# Patient Record
Sex: Male | Born: 1990 | Race: White | Hispanic: No | Marital: Married | Smoking: Never smoker
Health system: Southern US, Community
[De-identification: ages and names within clinical notes are randomized; demographics above are authoritative.]

## PROBLEM LIST (undated history)

## (undated) DIAGNOSIS — K589 Irritable bowel syndrome without diarrhea: Secondary | ICD-10-CM

## (undated) DIAGNOSIS — K219 Gastro-esophageal reflux disease without esophagitis: Secondary | ICD-10-CM

## (undated) DIAGNOSIS — J45909 Unspecified asthma, uncomplicated: Secondary | ICD-10-CM

## (undated) HISTORY — DX: Irritable bowel syndrome, unspecified: K58.9

## (undated) HISTORY — DX: Unspecified asthma, uncomplicated: J45.909

---
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
## 2010-07-20 ENCOUNTER — Emergency Department (HOSPITAL_COMMUNITY): Admission: EM | Admit: 2010-07-20 | Discharge: 2010-07-21 | Payer: Self-pay | Admitting: Emergency Medicine

## 2015-09-23 ENCOUNTER — Other Ambulatory Visit: Payer: Self-pay | Admitting: Student

## 2015-09-23 DIAGNOSIS — R1084 Generalized abdominal pain: Secondary | ICD-10-CM

## 2015-09-25 ENCOUNTER — Ambulatory Visit
Admission: RE | Admit: 2015-09-25 | Discharge: 2015-09-25 | Disposition: A | Discharge status: Home / Self Care | Payer: 59 | Source: Ambulatory Visit | Attending: Student | Admitting: Student

## 2015-09-25 DIAGNOSIS — R1084 Generalized abdominal pain: Secondary | ICD-10-CM | POA: Diagnosis present

## 2017-07-17 ENCOUNTER — Emergency Department: Payer: 59

## 2017-07-17 ENCOUNTER — Emergency Department
Admission: EM | Admit: 2017-07-17 | Discharge: 2017-07-17 | Disposition: A | Discharge status: Home / Self Care | Payer: 59 | Attending: Student in an Organized Health Care Education/Training Program | Admitting: Student in an Organized Health Care Education/Training Program

## 2017-07-17 ENCOUNTER — Encounter: Payer: Self-pay | Admitting: Emergency Medicine

## 2017-07-17 DIAGNOSIS — R109 Unspecified abdominal pain: Secondary | ICD-10-CM | POA: Diagnosis not present

## 2017-07-17 DIAGNOSIS — R300 Dysuria: Secondary | ICD-10-CM | POA: Diagnosis present

## 2017-07-17 HISTORY — DX: Gastro-esophageal reflux disease without esophagitis: K21.9

## 2017-07-17 LAB — URINALYSIS, COMPLETE (UACMP) WITH MICROSCOPIC
BILIRUBIN URINE: NEGATIVE
Bacteria, UA: NONE SEEN
GLUCOSE, UA: NEGATIVE mg/dL
HGB URINE DIPSTICK: NEGATIVE
Ketones, ur: 20 mg/dL — AB
Leukocytes, UA: NEGATIVE
NITRITE: NEGATIVE
PH: 6 (ref 5.0–8.0)
Protein, ur: NEGATIVE mg/dL
RBC / HPF: NONE SEEN RBC/hpf (ref 0–5)
SPECIFIC GRAVITY, URINE: 1.002 — AB (ref 1.005–1.030)
Squamous Epithelial / LPF: NONE SEEN

## 2017-07-17 LAB — CBC WITH DIFFERENTIAL/PLATELET
Basophils Absolute: 0.1 10*3/uL (ref 0–0.1)
Basophils Relative: 1 %
EOS ABS: 0 10*3/uL (ref 0–0.7)
EOS PCT: 0 %
HCT: 48.5 % (ref 40.0–52.0)
HEMOGLOBIN: 16.7 g/dL (ref 13.0–18.0)
LYMPHS ABS: 2.6 10*3/uL (ref 1.0–3.6)
Lymphocytes Relative: 25 %
MCH: 30.7 pg (ref 26.0–34.0)
MCHC: 34.5 g/dL (ref 32.0–36.0)
MCV: 88.9 fL (ref 80.0–100.0)
MONOS PCT: 7 %
Monocytes Absolute: 0.8 10*3/uL (ref 0.2–1.0)
NEUTROS PCT: 67 %
Neutro Abs: 7.2 10*3/uL — ABNORMAL HIGH (ref 1.4–6.5)
Platelets: 350 10*3/uL (ref 150–440)
RBC: 5.45 MIL/uL (ref 4.40–5.90)
RDW: 13.3 % (ref 11.5–14.5)
WBC: 10.7 10*3/uL — ABNORMAL HIGH (ref 3.8–10.6)

## 2017-07-17 LAB — COMPREHENSIVE METABOLIC PANEL
ALT: 23 U/L (ref 17–63)
ANION GAP: 13 (ref 5–15)
AST: 27 U/L (ref 15–41)
Albumin: 5.2 g/dL — ABNORMAL HIGH (ref 3.5–5.0)
Alkaline Phosphatase: 46 U/L (ref 38–126)
BILIRUBIN TOTAL: 1.6 mg/dL — AB (ref 0.3–1.2)
BUN: 12 mg/dL (ref 6–20)
CALCIUM: 9.6 mg/dL (ref 8.9–10.3)
CO2: 24 mmol/L (ref 22–32)
Chloride: 103 mmol/L (ref 101–111)
Creatinine, Ser: 1.07 mg/dL (ref 0.61–1.24)
Glucose, Bld: 73 mg/dL (ref 65–99)
POTASSIUM: 3.7 mmol/L (ref 3.5–5.1)
SODIUM: 140 mmol/L (ref 135–145)
TOTAL PROTEIN: 8.4 g/dL — AB (ref 6.5–8.1)

## 2017-07-17 LAB — LIPASE, BLOOD: LIPASE: 35 U/L (ref 11–51)

## 2017-07-17 MED ORDER — TRAMADOL HCL 50 MG PO TABS: 50.0000 mg | ORAL_TABLET | Freq: Four times a day (QID) | ORAL | 0 refills | Status: AC | PRN

## 2017-07-17 MED ORDER — AZITHROMYCIN 500 MG PO TABS
1000.0000 mg | ORAL_TABLET | Freq: Once | ORAL | Status: AC
  Administered 2017-07-17: 1000 mg via ORAL
  Filled 2017-07-17: qty 2

## 2017-07-17 MED ORDER — KETOROLAC TROMETHAMINE 30 MG/ML IJ SOLN
15.0000 mg | Freq: Once | INTRAMUSCULAR | Status: AC
  Administered 2017-07-17: 15 mg via INTRAVENOUS
  Filled 2017-07-17: qty 1

## 2017-07-17 MED ORDER — PROMETHAZINE HCL 12.5 MG PO TABS: 12.5000 mg | ORAL_TABLET | Freq: Four times a day (QID) | ORAL | 0 refills | Status: DC | PRN

## 2017-07-17 MED ORDER — SODIUM CHLORIDE 0.9 % IV BOLUS (SEPSIS)
1000.0000 mL | Freq: Once | INTRAVENOUS | Status: AC
  Administered 2017-07-17: 1000 mL via INTRAVENOUS

## 2017-07-17 MED ORDER — SODIUM CHLORIDE 0.9 % IV BOLUS (SEPSIS): 1000.0000 mL | Freq: Once | INTRAVENOUS | Status: DC

## 2017-07-17 MED ORDER — CEFTRIAXONE SODIUM 250 MG IJ SOLR
250.0000 mg | Freq: Once | INTRAMUSCULAR | Status: AC
  Administered 2017-07-17: 250 mg via INTRAMUSCULAR
  Filled 2017-07-17: qty 250

## 2017-07-17 MED ORDER — IOPAMIDOL (ISOVUE-300) INJECTION 61%
100.0000 mL | Freq: Once | INTRAVENOUS | Status: AC | PRN
  Administered 2017-07-17: 100 mL via INTRAVENOUS

## 2017-07-17 MED ORDER — DOXYCYCLINE HYCLATE 50 MG PO CAPS: 100.0000 mg | ORAL_CAPSULE | Freq: Two times a day (BID) | ORAL | 0 refills | Status: AC

## 2017-07-17 MED ORDER — MORPHINE SULFATE (PF) 4 MG/ML IV SOLN
4.0000 mg | INTRAVENOUS | Status: DC | PRN
  Administered 2017-07-17: 4 mg via INTRAVENOUS
  Filled 2017-07-17: qty 1

## 2017-07-17 MED ORDER — PROMETHAZINE HCL 25 MG/ML IJ SOLN
12.5000 mg | Freq: Four times a day (QID) | INTRAMUSCULAR | Status: DC | PRN
  Administered 2017-07-17: 12.5 mg via INTRAVENOUS
  Filled 2017-07-17: qty 1

## 2017-07-17 NOTE — ED Triage Notes (Signed)
Back/flank pain x 2 weeks. Has been seen at [facility] 2x with ketones in urine. Weight loss of approx 8 lbs in that time stating unable to eat. No appetite and abd pain.

## 2017-07-17 NOTE — ED Notes (Signed)
ED Provider at bedside. 

## 2017-07-17 NOTE — ED Provider Notes (Signed)
[facility] Emergency Department Provider Note    First MD Initiated Contact with Patient 07/17/17 1514     (approximate)  I have reviewed the triage vital signs and the nursing notes.   HISTORY  Chief Complaint Dysuria    HPI Johnathan Caldwell is a 26 y.o. male presents with chief complaint of bilateral flank pain and dark and foul-smelling urine. Patient states it feels like kidney infections which she has had in the past. States he does have some burning when he is. Denies any nausea vomiting or diarrhea. Does have issues with acid reflux and has been on Nexium. States he's had decreased appetite over the past 2 weeks and has had several pound weight loss. No fevers at home. No shortness of breath or chest pain. No family history of inflammatory bowel disease. No personal history of diabetes or kidney stones.   Past Medical History:  Diagnosis Date  . Acid reflux    History reviewed. No pertinent family history. History reviewed. No pertinent surgical history. There are no active problems to display for this patient.     Prior to Admission medications   Medication Sig Start Date End Date Taking? Authorizing Provider  doxycycline (VIBRAMYCIN) 50 MG capsule Take 2 capsules (100 mg total) by mouth 2 (two) times daily. 07/17/17 07/27/17  Willy Eddy, MD  promethazine (PHENERGAN) 12.5 MG tablet Take 1 tablet (12.5 mg total) by mouth every 6 (six) hours as needed for nausea or vomiting. 07/17/17   Willy Eddy, MD  traMADol (ULTRAM) 50 MG tablet Take 1 tablet (50 mg total) by mouth every 6 (six) hours as needed. 07/17/17 07/17/18  Willy Eddy, MD    Allergies Patient has no known allergies.    Social History Social History  Substance Use Topics  . Smoking status: Never Smoker  . Smokeless tobacco: Never Used  . Alcohol use No    Review of Systems Patient denies headaches, rhinorrhea, blurry vision, numbness, shortness of breath,  chest pain, edema, cough, abdominal pain, nausea, vomiting, diarrhea, dysuria, fevers, rashes or hallucinations unless otherwise stated above in HPI. ____________________________________________   PHYSICAL EXAM:  VITAL SIGNS: Vitals:   07/17/17 1502  BP: 125/87  Pulse: (!) 103  Resp: 16  Temp: 98.1 F (36.7 C)  SpO2: 99%    Constitutional: Alert and oriented. Well appearing and in no acute distress. Eyes: Conjunctivae are normal.  Head: Atraumatic. Nose: No congestion/rhinnorhea. Mouth/Throat: Mucous membranes are moist.   Neck: No stridor. Painless ROM.  Cardiovascular: Normal rate, regular rhythm. Grossly normal heart sounds.  Good peripheral circulation. Respiratory: Normal respiratory effort.  No retractions. Lungs CTAB. Gastrointestinal: Soft and nontender. No distention. No abdominal bruits. No CVA tenderness. Genitourinary:  Musculoskeletal: No lower extremity tenderness nor edema.  No joint effusions. Neurologic:  Normal speech and language. No gross focal neurologic deficits are appreciated. No facial droop Skin:  Skin is warm, dry and intact. No rash noted. Psychiatric: Mood and affect are normal. Speech and behavior are normal.  ____________________________________________   LABS (all labs ordered are listed, but only abnormal results are displayed)  Results for orders placed or performed during the hospital encounter of 07/17/17 (from the past 24 hour(s))  Urinalysis, Complete w Microscopic     Status: Abnormal   Collection Time: 07/17/17  3:16 PM  Result Value Ref Range   Color, Urine STRAW (A) YELLOW   APPearance CLEAR (A) CLEAR   Specific Gravity, Urine 1.002 (L) 1.005 - 1.030   pH  6.0 5.0 - 8.0   Glucose, UA NEGATIVE NEGATIVE mg/dL   Hgb urine dipstick NEGATIVE NEGATIVE   Bilirubin Urine NEGATIVE NEGATIVE   Ketones, ur 20 (A) NEGATIVE mg/dL   Protein, ur NEGATIVE NEGATIVE mg/dL   Nitrite NEGATIVE NEGATIVE   Leukocytes, UA NEGATIVE NEGATIVE   RBC /  HPF NONE SEEN 0 - 5 RBC/hpf   WBC, UA 0-5 0 - 5 WBC/hpf   Bacteria, UA NONE SEEN NONE SEEN   Squamous Epithelial / LPF NONE SEEN NONE SEEN  CBC with Differential/Platelet     Status: Abnormal   Collection Time: 07/17/17  3:16 PM  Result Value Ref Range   WBC 10.7 (H) 3.8 - 10.6 K/uL   RBC 5.45 4.40 - 5.90 MIL/uL   Hemoglobin 16.7 13.0 - 18.0 g/dL   HCT 45.448.5 09.840.0 - 11.952.0 %   MCV 88.9 80.0 - 100.0 fL   MCH 30.7 26.0 - 34.0 pg   MCHC 34.5 32.0 - 36.0 g/dL   RDW 14.713.3 82.911.5 - 56.214.5 %   Platelets 350 150 - 440 K/uL   Neutrophils Relative % 67 %   Neutro Abs 7.2 (H) 1.4 - 6.5 K/uL   Lymphocytes Relative 25 %   Lymphs Abs 2.6 1.0 - 3.6 K/uL   Monocytes Relative 7 %   Monocytes Absolute 0.8 0.2 - 1.0 K/uL   Eosinophils Relative 0 %   Eosinophils Absolute 0.0 0 - 0.7 K/uL   Basophils Relative 1 %   Basophils Absolute 0.1 0 - 0.1 K/uL  Comprehensive metabolic panel     Status: Abnormal   Collection Time: 07/17/17  3:16 PM  Result Value Ref Range   Sodium 140 135 - 145 mmol/L   Potassium 3.7 3.5 - 5.1 mmol/L   Chloride 103 101 - 111 mmol/L   CO2 24 22 - 32 mmol/L   Glucose, Bld 73 65 - 99 mg/dL   BUN 12 6 - 20 mg/dL   Creatinine, Ser 1.301.07 0.61 - 1.24 mg/dL   Calcium 9.6 8.9 - 86.510.3 mg/dL   Total Protein 8.4 (H) 6.5 - 8.1 g/dL   Albumin 5.2 (H) 3.5 - 5.0 g/dL   AST 27 15 - 41 U/L   ALT 23 17 - 63 U/L   Alkaline Phosphatase 46 38 - 126 U/L   Total Bilirubin 1.6 (H) 0.3 - 1.2 mg/dL   GFR calc non Af Amer >60 >60 mL/min   GFR calc Af Amer >60 >60 mL/min   Anion gap 13 5 - 15  Lipase, blood     Status: None   Collection Time: 07/17/17  3:16 PM  Result Value Ref Range   Lipase 35 11 - 51 U/L   ____________________________________________ ____________________________________________  RADIOLOGY  I personally reviewed all radiographic images ordered to evaluate for the above acute complaints and reviewed radiology reports and findings.  These findings were personally discussed with the  patient.  Please see medical record for radiology report.  ____________________________________________   PROCEDURES  Procedure(s) performed:  Procedures    Critical Care performed: no ____________________________________________   INITIAL IMPRESSION / ASSESSMENT AND PLAN / ED COURSE  Pertinent labs & imaging results that were available during my care of the patient were reviewed by me and considered in my medical decision making (see chart for details).  DDX: stone, pyelo, dehydration, dka, dm, enteritis, uti  Johnathan Caldwell is a 26 y.o. who presents to the ED with Back pain as described above. Patient afebrile and in no acute  distress. Blood work sent for the above differential shows mild leukocytosis but patient without any other signs or symptoms of acute infectious process. CT imaging ordered to evaluate for the above differential shows no acute intra-abdominal abnormality. Specifically there is no evidence of kidney stone or perinephric stranding to suggest pyelonephritis. There is no evidence of appendicitis. He does not have evidence of DKA or diabetes. Etiology of patient's pain most likely musculoskeletal in nature. Patient denies any h/o transmitted infections but based on his symptoms I have discussed with recommendation to empirically treat for STI and the patient agrees to this.   Patient stable for follow-up as an outpatient.  Have discussed with the patient and available family all diagnostics and treatments performed thus far and all questions were answered to the best of my ability. The patient demonstrates understanding and agreement with plan.       ____________________________________________   FINAL CLINICAL IMPRESSION(S) / ED DIAGNOSES  Final diagnoses:  Bilateral flank pain  Dysuria      NEW MEDICATIONS STARTED DURING THIS VISIT:  New Prescriptions   DOXYCYCLINE (VIBRAMYCIN) 50 MG CAPSULE    Take 2 capsules (100 mg total) by mouth 2 (two) times  daily.   PROMETHAZINE (PHENERGAN) 12.5 MG TABLET    Take 1 tablet (12.5 mg total) by mouth every 6 (six) hours as needed for nausea or vomiting.   TRAMADOL (ULTRAM) 50 MG TABLET    Take 1 tablet (50 mg total) by mouth every 6 (six) hours as needed.     Note:  This document was prepared using Dragon voice recognition software and may include unintentional dictation errors.    Willy Eddy, MD 07/17/17 [phone]

## 2017-07-17 NOTE — ED Notes (Signed)
FIRST NURSE NOTE: [facility], was found to have ketones in urinalysis.

## 2017-07-17 NOTE — Discharge Instructions (Signed)

## 2017-07-19 LAB — URINE CULTURE: Culture: NO GROWTH

## 2017-07-22 ENCOUNTER — Telehealth: Payer: Self-pay | Admitting: Emergency Medicine

## 2017-07-22 NOTE — Telephone Encounter (Signed)
Patient called to say he is having some diarrhea with taking the doxycycline.  He has been eating yogurt.  I advised him to follow up with pcp, but that he could always return here if needed.

## 2019-01-24 IMAGING — CT CT ABD-PELV W/ CM
2 of 4 series · 16 of 46 positions shown, 18 images · IV contrast (iopamidol)
Comparison: No priors.

CLINICAL DATA: 25-year-old male with history of back and flank pain
for the past 2 weeks. Weight loss of 8 pounds over the past 2 weeks.
Unable to eat.

EXAM:
CT ABDOMEN AND PELVIS WITH CONTRAST
TECHNIQUE: Multidetector CT imaging of the abdomen and pelvis was performed
using the standard protocol following bolus administration of
intravenous contrast.
CONTRAST:  100mL ZAUVGN-J33 IOPAMIDOL (ZAUVGN-J33) INJECTION 61%

[Series 2: routine abd/pel with · axial · 0.65mm/px · z∈[-892,-522]mm · 13 of 82 slices shown, 15 images]
[im 4/82  soft-tissue]
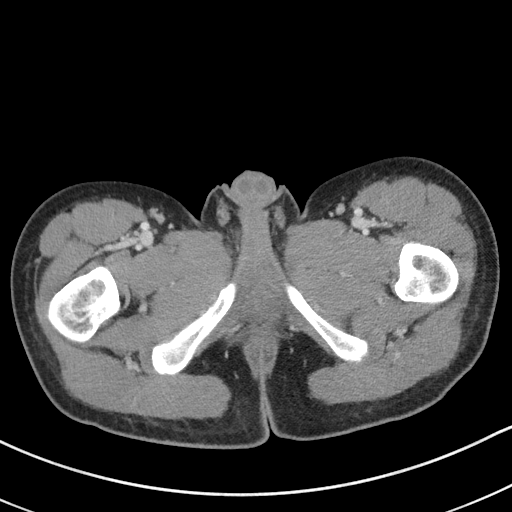
[im 4/82  bone]
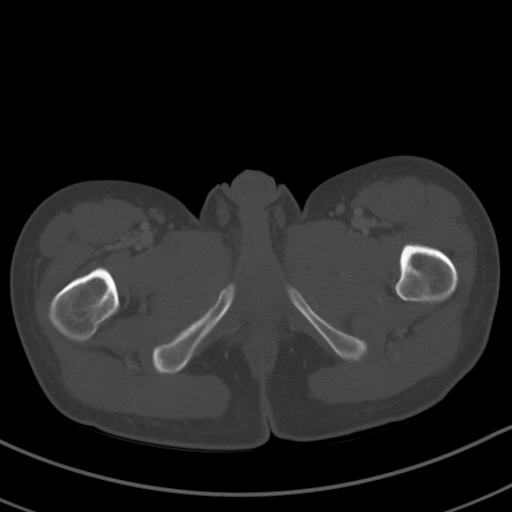
[im 10/82  soft-tissue]
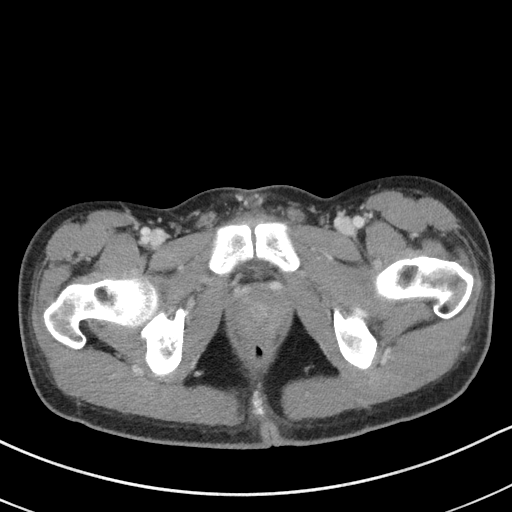
[im 17/82  soft-tissue]
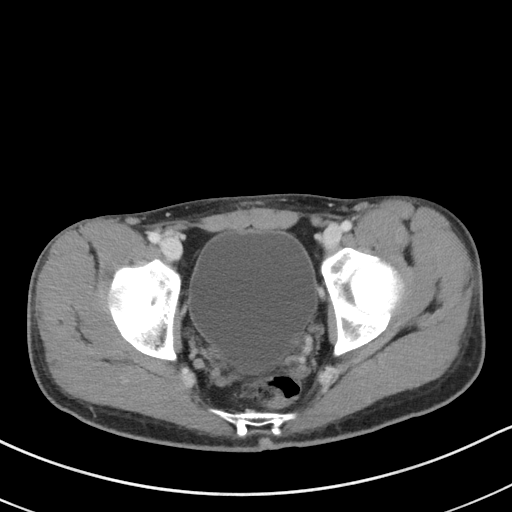
[im 23/82  soft-tissue]
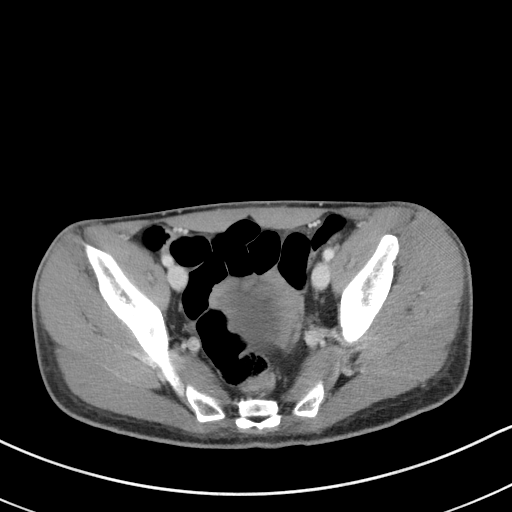
[im 30/82  soft-tissue]
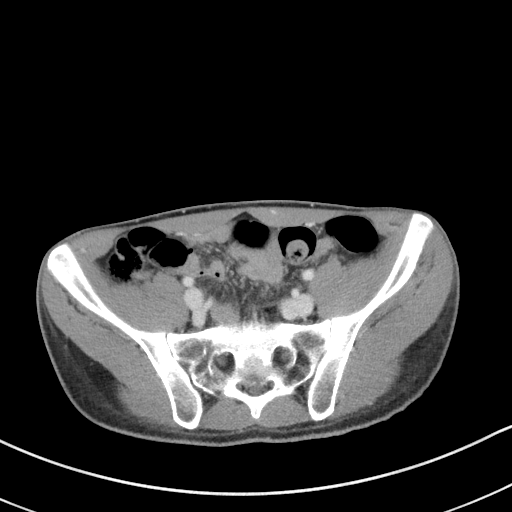
[im 36/82  soft-tissue]
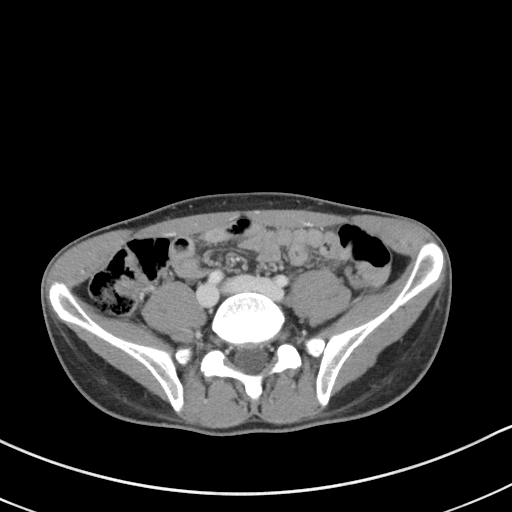
[im 43/82  soft-tissue]
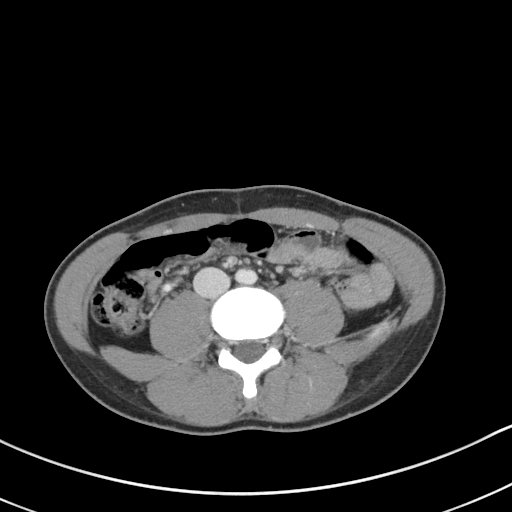
[im 46/82  soft-tissue]
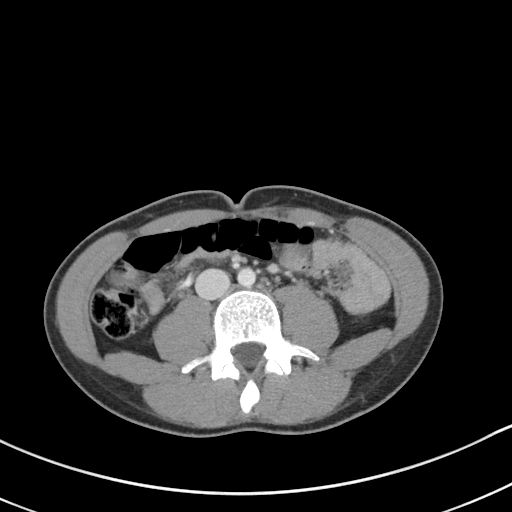
[im 52/82  soft-tissue]
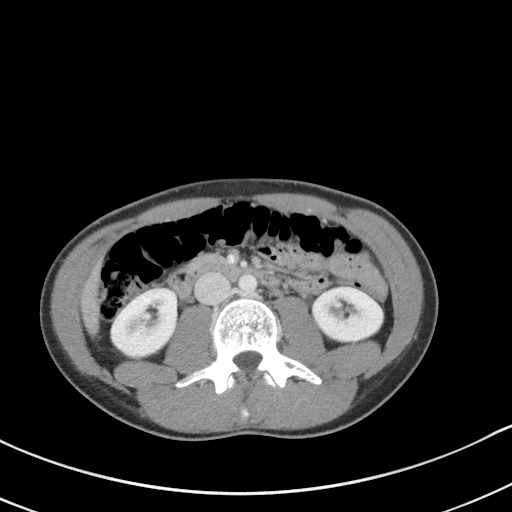
[im 52/82  bone]
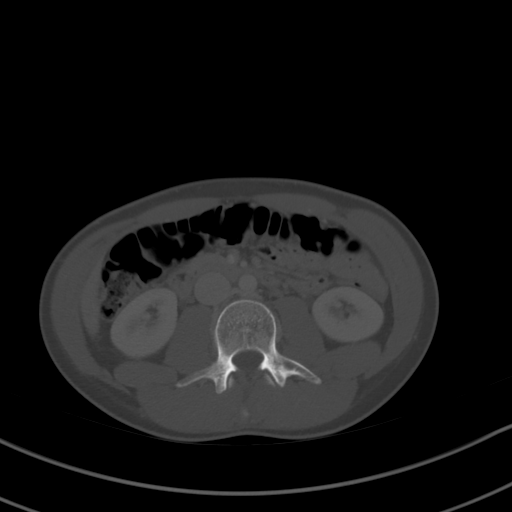
[im 59/82  soft-tissue]
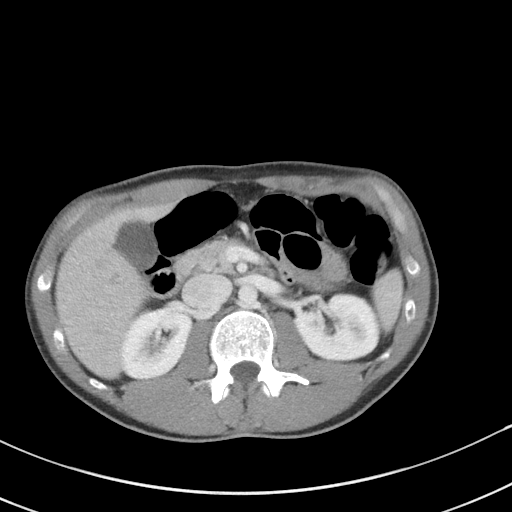
[im 65/82  soft-tissue]
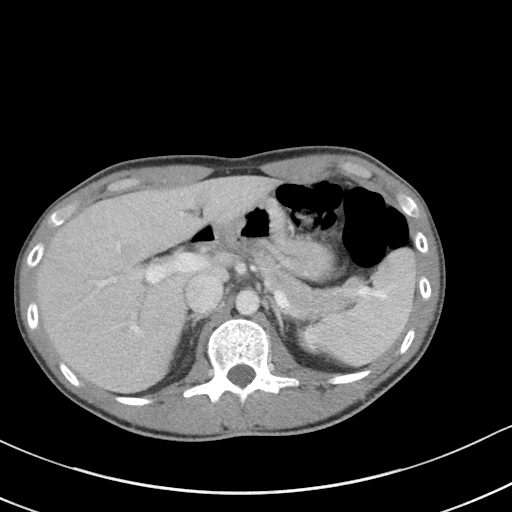
[im 72/82  soft-tissue]
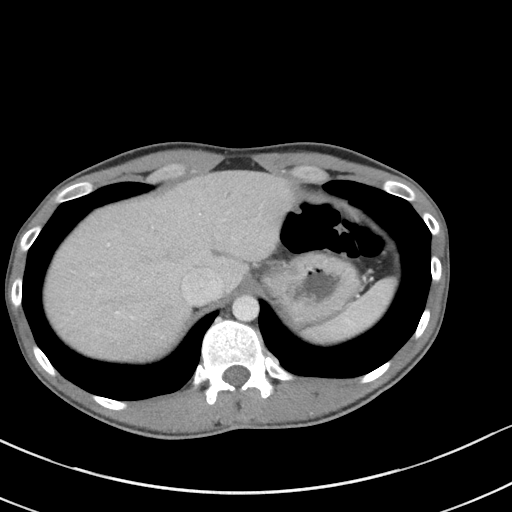
[im 78/82  soft-tissue]
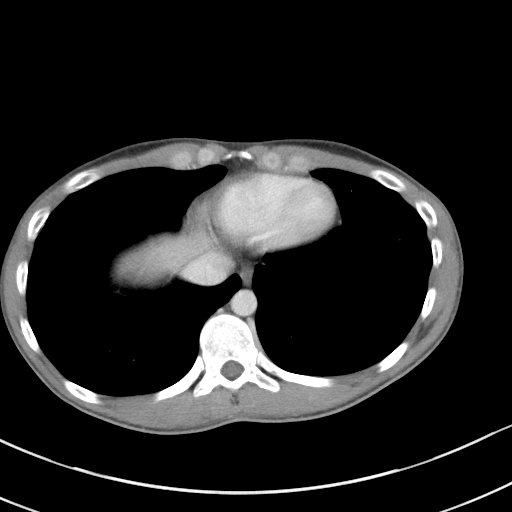

[Series 5: coronal st · coronal · 0.68mm/px · 3 of 67 slices shown]
[im 23/67  soft-tissue]
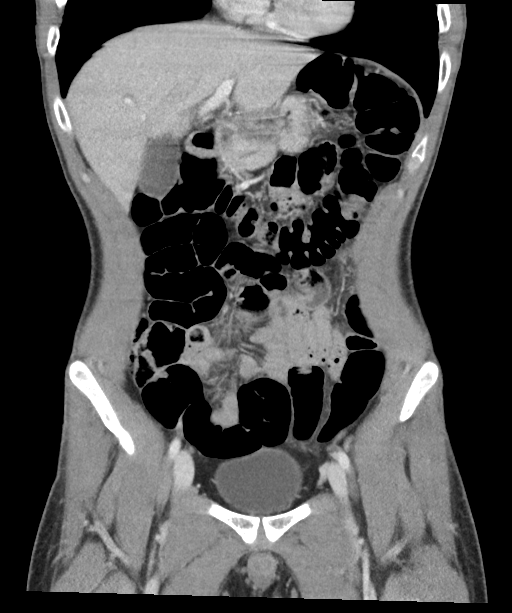
[im 30/67  soft-tissue]
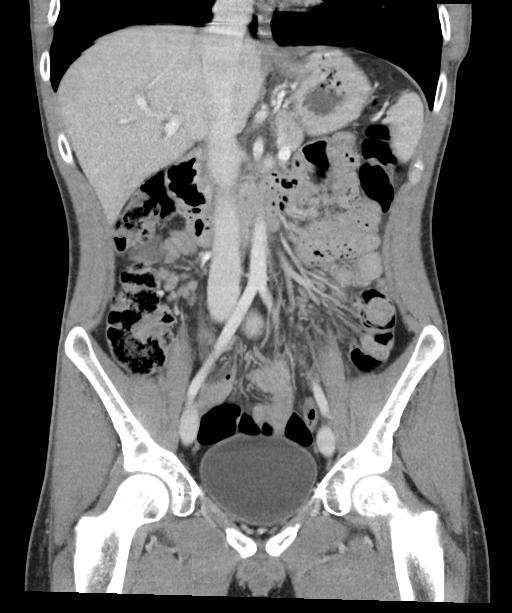
[im 37/67  soft-tissue]
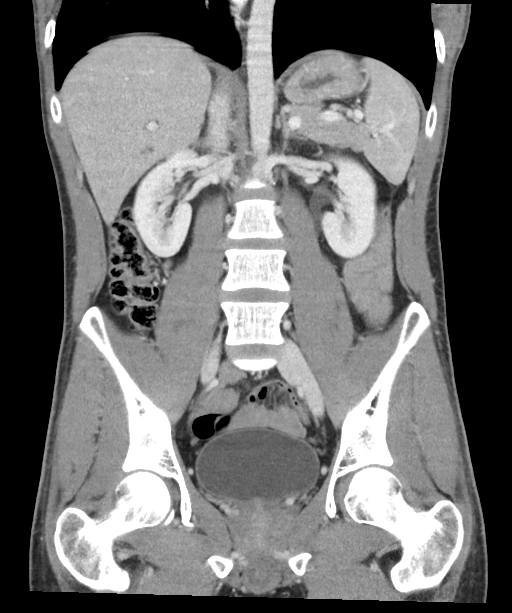

[16 of 46 positions shown; findings below may reference images not displayed]

FINDINGS: Lower chest: Unremarkable.

Hepatobiliary: 8 mm low-attenuation lesion in segment 6 of the liver
is too small to characterize, but is statistically likely a tiny
cyst. No other suspicious hepatic lesions are noted. No intra or
extrahepatic biliary ductal dilatation. There appears to be mild
periportal edema. Gallbladder is normal in appearance.

Pancreas: No pancreatic mass. No pancreatic ductal dilatation. No
pancreatic or peripancreatic fluid or inflammatory changes.

Spleen: Unremarkable.

Adrenals/Urinary Tract: Bilateral kidneys and bilateral adrenal
glands are normal in appearance. No hydroureteronephrosis. Urinary
bladder is normal in appearance.

Stomach/Bowel: Normal appearance of the stomach. No pathologic
dilatation of small bowel or colon. The appendix is not confidently
identified and may be surgically absent. Regardless, there are no
inflammatory changes noted adjacent to the cecum to suggest the
presence of an acute appendicitis at this time.

Vascular/Lymphatic: No significant atherosclerotic disease, aneurysm
or dissection noted in the abdominal or pelvic vasculature.
Circumaortic left renal vein (normal anatomical variant)
incidentally noted. No lymphadenopathy identified in the abdomen or
pelvis.

Reproductive: Prostate gland and seminal vesicles are unremarkable
in appearance.

Other: No significant volume of ascites.  No pneumoperitoneum.

Musculoskeletal: There are no aggressive appearing lytic or blastic
lesions noted in the visualized portions of the skeleton.
IMPRESSION: 1. No acute findings are noted in the abdomen or pelvis to account
for the patient's symptoms.
2. Incidental findings, as above.

## 2020-03-14 ENCOUNTER — Ambulatory Visit: Payer: 59 | Attending: Internal Medicine

## 2020-03-14 DIAGNOSIS — Z23 Encounter for immunization: Secondary | ICD-10-CM

## 2020-03-14 NOTE — Progress Notes (Signed)
   Covid-19 Vaccination Clinic  Name:  Johnathan Caldwell    MRN: [id number] DOB: [birth year]  03/14/2020  [name] was observed post Covid-19 immunization for 15 minutes without incident. He was provided with Vaccine Information Sheet and instruction to access the V-Safe system.   [name] was instructed to call 911 with any severe reactions post vaccine: Marland Kitchen Difficulty breathing  . Swelling of face and throat  . A fast heartbeat  . A bad rash all over body  . Dizziness and weakness   Immunizations Administered    Name Date Dose VIS Date Route   Pfizer COVID-19 Vaccine 03/14/2020 11:13 AM 0.3 mL 11/16/2019 Intramuscular   Manufacturer: [employer], [employer]   Lot: [id number]   NDC: [address]-3

## 2020-04-08 ENCOUNTER — Ambulatory Visit: Payer: 59 | Attending: Internal Medicine

## 2020-04-08 DIAGNOSIS — Z23 Encounter for immunization: Secondary | ICD-10-CM

## 2020-04-08 NOTE — Progress Notes (Signed)
   Covid-19 Vaccination Clinic  Name:  Johnathan Caldwell    MRN: [id number] DOB: 09-10-1991  04/08/2020  Johnathan Caldwell was observed post Covid-19 immunization for 15 minutes without incident. He was provided with Vaccine Information Sheet and instruction to access the V-Safe system.   Johnathan Caldwell was instructed to call 911 with any severe reactions post vaccine: Marland Kitchen Difficulty breathing  . Swelling of face and throat  . A fast heartbeat  . A bad rash all over body  . Dizziness and weakness   Immunizations Administered    Name Date Dose VIS Date Route   Pfizer COVID-19 Vaccine 04/08/2020 11:15 AM 0.3 mL 01/30/2019 Intramuscular   Manufacturer: [employer], [employer]   Lot: [id number]   NDC: [address]-4

## 2022-04-05 NOTE — Progress Notes (Addendum)
? ?04/06/22 ?4:18 PM  ? ?Johnathan Caldwell ?[birth year] ?[id number] ? ?Referring provider:  ?Jerl Mina, MD ?[address] [place] ?[facility] ?[place],  [place] [address] ?Chief Complaint  ?Patient presents with  ? epididymitis  ? ? ? ?HPI: ?Johnathan Caldwell is a 31 y.o.male who presents today for further evaluation of right epididymitis.  ? ?He has a personal history of recurrent prostatitis, intermittent dysuria, intermittent testicular pain and pelvic pain as above. ? ?He was seen by his PCP Dr Burnett Sheng on 03/10/2022 he was noted to have presumed acute prostatitis and right epididymitis.  He was placed on Bactrim. Urine was not sent for culture/ UA..  ? ?He has been seen in the past by [facility] urology for similar symptoms of urinary urgency, frequency, dark urine. He has had negative scrotal ultrasounds and was referred to physical therapy.  He never proceeded with physical therapy.  His symptoms started back around 2018 and of been intermittently severe with waxing and waning over the past 5 or so years.  He cannot identify any triggers. ? ?He reports that he has tenderness in his testicles specifically in his right and that his left is lower than his right. He reports that after ejaculation he has a throbbing pain underneath his testosterone.  ? ?He also has a personal history of functional bowel syndrome.  He struggled with this off and on predating his urinary issues. ? ?He reports that he has a family history of prostate cancer.  ? ? ?PMH: ?Past Medical History:  ?Diagnosis Date  ? Acid reflux   ? Asthma   ? IBS (irritable bowel syndrome)   ? ? ?Surgical History: ?History reviewed. No pertinent surgical history. ? ?Home Medications:  ?Allergies as of 04/06/2022   ?No Known Allergies ?  ? ?  ?Medication List  ?  ? ?  ? Accurate as of Apr 06, 2022  4:18 PM. If you have any questions, ask your nurse or doctor.  ?  ?  ? ?  ? ?STOP taking these medications   ? ?albuterol 108 (90 Base) MCG/ACT inhaler ?Commonly known as: VENTOLIN  HFA ?Stopped by: Vanna Scotland, MD ?  ?alum & mag hydroxide-simeth 200-200-20 MG/5ML suspension ?Commonly known as: MAALOX/MYLANTA ?Stopped by: Vanna Scotland, MD ?  ?busPIRone 5 MG tablet ?Commonly known as: BUSPAR ?Stopped by: Vanna Scotland, MD ?  ?citalopram 10 MG tablet ?Commonly known as: CELEXA ?Stopped by: Vanna Scotland, MD ?  ?montelukast 10 MG tablet ?Commonly known as: SINGULAIR ?Stopped by: Vanna Scotland, MD ?  ?promethazine 12.5 MG tablet ?Commonly known as: PHENERGAN ?Stopped by: Vanna Scotland, MD ?  ? ?  ? ?TAKE these medications   ? ?cetirizine 10 MG tablet ?Commonly known as: ZYRTEC ?Take 1 tablet by mouth daily. ?  ?esomeprazole 40 MG capsule ?Commonly known as: NEXIUM ?Take by mouth. ?  ?meloxicam 7.5 MG tablet ?Commonly known as: Mobic ?Take 1 tablet (7.5 mg total) by mouth daily. ?Started by: Vanna Scotland, MD ?  ?sulfamethoxazole-trimethoprim 800-160 MG tablet ?Commonly known as: BACTRIM DS ?Take by mouth. ?  ? ?  ? ? ?Allergies:  ?No Known Allergies ? ?Family History: ?Family History  ?Problem Relation Age of Onset  ? Kidney disease Mother   ? Ovarian cancer Mother   ? Prostate cancer Maternal Grandfather   ? ? ?Social History:  reports that he has never smoked. He has never used smokeless tobacco. He reports that he does not drink alcohol. No history on file for drug  use. ? ? ?Physical Exam: ?BP 114/79   Pulse 71   Ht 5\' 6"  (1.676 m)   Wt 154 lb (69.9 kg)   BMI 24.86 kg/m?   ?Constitutional:  Alert and oriented, No acute distress.  Accompanied by girlfriend/fianc?? ?HEENT: [place] AT, moist mucus membranes.  Trachea midline, no masses. ?Cardiovascular: No clubbing, cyanosis, or edema. ?Respiratory: Normal respiratory effort, no increased work of breathing. ?GU;  No CVA tenderness.  No bladder fullness or masses.  Patient with circumcised phallus.   Urethral meatus is patent.  No penile discharge. No penile lesions or rashes. Scrotum without lesions, cysts, rashes and/or edema.  Bilateral  hydroceles, testicles are located scrotally bilaterally. No masses are appreciated in the testicles. Left and right epididymis are normal.  No obvious hernias. ?Skin: No rashes, bruises or suspicious lesions. ?Neurologic: Grossly intact, no focal deficits, moving all 4 extremities. ?Psychiatric: Normal mood and affect. ? ?Laboratory Data: ? ?Lab Results  ?Component Value Date  ? CREATININE 1.07 07/17/2017  ? ? ?Urinalysis:  ?Unremarkable  ? ?Assessment & Plan:   ? ?1.Chronic pelvic pain/testicular pain/ Chronic prostatitis  ?- Exam today was reassuring  ?- Discussed with him that when he is asymptomatic he should not be treated with antibiotic in the absence of infection.  Reviewing his record, he has received multiple rounds of antibiotics with negative urinalysis/urine cultures.  We discussed the importance of antibiotic stewardship.  We discussed that this will likely just exacerbate his functional bowel issues and the underlying cause is likely not infectious. ?- Discussed addition of NSAIDS to his regimen such as meloxicam as tolerated, hold for GI distress when he is having a flare of his symptoms to help address prostatic, urethral testicular inflammation ?- He was given an IC diet recommendation sheet, query whether he has underlying IC or food triggers exacerbating his flares ?- Referral sent to physical therapy, I do think he could benefit from evaluation and treatment ? ?2.  Family history of prostate cancer ?-We discussed the guidelines for prostate cancer screening; do think it is reasonable to go ahead and get a baseline PSA but would want to wait a few more weeks after his recent flare to ensure that there is noninflammatory component.  He is agreeable this plan. ? ?Follow-up 3 months for recheck of symptoms/PSA draw ? ?I,Kailey Littlejohn,acting as a scribe for 09/16/2017, MD.,have documented all relevant documentation on the behalf of Vanna Scotland, MD,as directed by  Vanna Scotland, MD while  in the presence of Vanna Scotland, MD. ? ?I have reviewed the above documentation for accuracy and completeness, and I agree with the above.  ? ?Vanna Scotland, MD ? ? ?[facility] ?[address] ?[place], [place] ?[phone] ? ?I spent 50 total minutes on the day of the encounter including pre-visit review of the medical record, face-to-face time with the patient, and post visit ordering of labs/imaging/tests.  10 minutes were spent reviewing his records from his primary care as well as [facility] urology.  I spent at least 25 minutes face-to-face with the patient counseling him and obtaining history.  The remainder of the time spent charting. ? ?

## 2022-04-06 ENCOUNTER — Ambulatory Visit (INDEPENDENT_AMBULATORY_CARE_PROVIDER_SITE_OTHER): Payer: Self-pay | Admitting: Urology

## 2022-04-06 ENCOUNTER — Encounter: Payer: Self-pay | Admitting: Urology

## 2022-04-06 VITALS — BP 114/79 | HR 71 | Ht 66.0 in | Wt 154.0 lb

## 2022-04-06 DIAGNOSIS — N451 Epididymitis: Secondary | ICD-10-CM

## 2022-04-06 DIAGNOSIS — Z8042 Family history of malignant neoplasm of prostate: Secondary | ICD-10-CM

## 2022-04-06 DIAGNOSIS — N411 Chronic prostatitis: Secondary | ICD-10-CM

## 2022-04-06 DIAGNOSIS — G8929 Other chronic pain: Secondary | ICD-10-CM

## 2022-04-06 LAB — URINALYSIS, COMPLETE
Bilirubin, UA: NEGATIVE
Glucose, UA: NEGATIVE
Ketones, UA: NEGATIVE
Leukocytes,UA: NEGATIVE
Nitrite, UA: NEGATIVE
Protein,UA: NEGATIVE
RBC, UA: NEGATIVE
Specific Gravity, UA: >1.03 — ABNORMAL HIGH (ref 1.005–1.030)
Urobilinogen, Ur: 0.2 mg/dL (ref 0.2–1.0)
pH, UA: 6 (ref 5.0–7.5)

## 2022-04-06 LAB — MICROSCOPIC EXAMINATION: Bacteria, UA: NONE SEEN

## 2022-04-06 MED ORDER — MELOXICAM 7.5 MG PO TABS: 7.5000 mg | ORAL_TABLET | Freq: Every day | ORAL | 0 refills | Status: DC

## 2022-05-28 ENCOUNTER — Other Ambulatory Visit: Payer: Self-pay | Admitting: Urology

## 2022-05-28 DIAGNOSIS — N451 Epididymitis: Secondary | ICD-10-CM

## 2022-05-28 DIAGNOSIS — G8929 Other chronic pain: Secondary | ICD-10-CM

## 2022-07-07 ENCOUNTER — Ambulatory Visit: Payer: Self-pay | Admitting: Urology

## 2022-07-07 NOTE — Progress Notes (Incomplete)
    07/07/22 1:21 PM   Gad Sherre Poot July 10, 1991 [id number]  Referring provider:  Alan Mulder, MD [address] [place],  [place] [address] No chief complaint on file.      HPI: Johnathan Caldwell is a 30 y.o.male with a personal history of recurrent prostatitis, intermittent dysuria, intermittent testicular pain and pelvic pain who presents today for a 3 month follow-up with symtom recheck.  He has been seen in the past by [facility] for similar symptoms of urinary urgency, frequency and dark Urine. At time, he had a negative scrotal ultrasound and was referred to physical therapy.He never proceeded to physical therapy.And in 2018 he intermittently had severe waxing and waning symptoms.        PMH: Past Medical History:  Diagnosis Date   Acid reflux    Asthma    IBS (irritable bowel syndrome)     Surgical History: No past surgical history on file.  Home Medications:  Allergies as of 07/07/2022   No Known Allergies      Medication List        Accurate as of July 07, 2022  1:21 PM. If you have any questions, ask your nurse or doctor.          cetirizine 10 MG tablet Commonly known as: ZYRTEC Take 1 tablet by mouth daily.   esomeprazole 40 MG capsule Commonly known as: NEXIUM Take by mouth.   meloxicam 7.5 MG tablet Commonly known as: MOBIC TAKE ONE TABLET BY MOUTH DAILY        Allergies:  No Known Allergies  Family History: Family History  Problem Relation Age of Onset   Kidney disease Mother    Ovarian cancer Mother    Prostate cancer Maternal Grandfather     Social History:  reports that he has never smoked. He has never used smokeless tobacco. He reports that he does not drink alcohol. No history on file for drug use.   Physical Exam: There were no vitals taken for this visit.  Constitutional:  Alert and oriented, No acute distress. HEENT: [place] AT, moist mucus membranes.  Trachea midline, no masses. Cardiovascular: No clubbing,  cyanosis, or edema. Respiratory: Normal respiratory effort, no increased work of breathing. Skin: No rashes, bruises or suspicious lesions. Neurologic: Grossly intact, no focal deficits, moving all 4 extremities. Psychiatric: Normal mood and affect.  Laboratory Data:  Lab Results  Component Value Date   CREATININE 1.07 07/17/2017   No results found for: "HGBA1C"  Urinalysis   Pertinent Imaging:    Assessment & Plan:     No follow-ups on file.  I,Kailey Littlejohn,acting as a Neurosurgeon for Vanna Scotland, MD.,have documented all relevant documentation on the behalf of Vanna Scotland, MD,as directed by  Vanna Scotland, MD while in the presence of Vanna Scotland, MD.   [facility] [address] [place], [place] [address] [phone]

## 2022-07-09 ENCOUNTER — Encounter: Payer: Self-pay | Admitting: Urology

## 2022-08-10 ENCOUNTER — Encounter: Payer: Self-pay | Admitting: Urology

## 2022-08-10 ENCOUNTER — Ambulatory Visit (INDEPENDENT_AMBULATORY_CARE_PROVIDER_SITE_OTHER): Payer: 59 | Admitting: Urology

## 2022-08-10 VITALS — BP 129/88 | HR 84 | Ht 66.0 in | Wt 157.0 lb

## 2022-08-10 DIAGNOSIS — R102 Pelvic and perineal pain: Secondary | ICD-10-CM | POA: Diagnosis not present

## 2022-08-10 DIAGNOSIS — G8929 Other chronic pain: Secondary | ICD-10-CM

## 2022-08-10 DIAGNOSIS — N5319 Other ejaculatory dysfunction: Secondary | ICD-10-CM | POA: Diagnosis not present

## 2022-08-10 LAB — MICROSCOPIC EXAMINATION: Bacteria, UA: NONE SEEN

## 2022-08-10 LAB — URINALYSIS, COMPLETE
Bilirubin, UA: NEGATIVE
Glucose, UA: NEGATIVE
Ketones, UA: NEGATIVE
Leukocytes,UA: NEGATIVE
Nitrite, UA: NEGATIVE
Protein,UA: NEGATIVE
RBC, UA: NEGATIVE
Specific Gravity, UA: 1.015 (ref 1.005–1.030)
Urobilinogen, Ur: 0.2 mg/dL (ref 0.2–1.0)
pH, UA: 7.5 (ref 5.0–7.5)

## 2022-08-10 MED ORDER — MELOXICAM 7.5 MG PO TABS: 7.5000 mg | ORAL_TABLET | Freq: Every day | ORAL | 1 refills | Status: DC

## 2022-08-10 NOTE — Patient Instructions (Addendum)
Contact Physical therapy [phone]

## 2022-08-10 NOTE — Progress Notes (Signed)
08/10/2022 4:42 PM   Johnathan Caldwell 08/23/1991 [id number]  Referring provider: Alan Mulder, MD [address] [place],  [place] [address]  Chief Complaint  Patient presents with   Testicle Pain    HPI: 31 year old male with chronic testicular/ pelvic pain who returns today for routine follow-up.  He reports that he took the meloxicam as well as Flomax for about a month straight, continues to take Flomax but uses the meloxicam or NSAIDs intermittently as needed.  He does think that overall he is doing better.  Have bouts of episodes of primarily right testicular pain that come and go but is not consistent and not as severe.  He is concerned today about occasional pain with ejaculation.  He ejaculates 2-3 times a week and perhaps 1 of these will be uncomfortable or painful the other's are normal.  He has also noticed intermittent retrograde ejaculation.  He was referred to pelvic floor therapy but reports that he never received a call from them.  He reports that he is called them several times and they have told him that they will call him back to schedule which has not happened to date.   PMH: Past Medical History:  Diagnosis Date   Acid reflux    Asthma    IBS (irritable bowel syndrome)     Surgical History: No past surgical history on file.  Home Medications:  Allergies as of 08/10/2022   No Known Allergies      Medication List        Accurate as of August 10, 2022  4:42 PM. If you have any questions, ask your nurse or doctor.          cetirizine 10 MG tablet Commonly known as: ZYRTEC Take 1 tablet by mouth daily.   esomeprazole 40 MG capsule Commonly known as: NEXIUM Take by mouth.   meloxicam 7.5 MG tablet Commonly known as: MOBIC Take 1 tablet (7.5 mg total) by mouth daily.   tamsulosin 0.4 MG Caps capsule Commonly known as: FLOMAX TAKE ONE CAPSULE BY MOUTH DAILY 30 MINUTES AFTER SAME MEAL EACH DAY        Allergies: No Known  Allergies  Family History: Family History  Problem Relation Age of Onset   Kidney disease Mother    Ovarian cancer Mother    Prostate cancer Maternal Grandfather     Social History:  reports that he has never smoked. He has never used smokeless tobacco. He reports that he does not drink alcohol. No history on file for drug use.   Physical Exam: BP 129/88   Pulse 84   Ht 5\' 6"  (1.676 m)   Wt 157 lb (71.2 kg)   BMI 25.34 kg/m   Constitutional:  Alert and oriented, No acute distress. HEENT: [place] AT, moist mucus membranes.  Trachea midline, no masses. Cardiovascular: No clubbing, cyanosis, or edema. Skin: No rashes, bruises or suspicious lesions. Neurologic: Grossly intact, no focal deficits, moving all 4 extremities. Psychiatric: Normal mood and affect.  Laboratory Data: Lab Results  Component Value Date   WBC 10.7 (H) 07/17/2017   HGB 16.7 07/17/2017   HCT 48.5 07/17/2017   MCV 88.9 07/17/2017   PLT 350 07/17/2017    Lab Results  Component Value Date   CREATININE 1.07 07/17/2017    Urinalysis UA today is negative  Assessment & Plan:    1. Chronic pelvic pain in male Improving on Flomax and meloxicam as needed  I have advised him to only use  the meloxicam sparingly due to concern for GI distress but continue Flomax daily as tolerated unless he continues to have episodes of lightheadedness  I still continue to believe that he would benefit from pelvic floor therapy, referral has been placed he was provided with the number again today.  He is interested in proceeding with this.   2. Ejaculatory disorder We discussed the side effect of Flomax being retrograde ejaculation which in general is not painful  He may have a inflammatory component contributing to his occasional discomfort, would recommend pretreatment with NSAIDs 1 to 2 hours prior to intercourse to see if that is helpful  - Urinalysis, Complete - meloxicam (MOBIC) 7.5 MG tablet; Take 1 tablet (7.5 mg  total) by mouth daily.  Dispense: 30 tablet; Refill: 1  - meloxicam (MOBIC) 7.5 MG tablet; Take 1 tablet (7.5 mg total) by mouth daily.  Dispense: 30 tablet; Refill: 1    Return in about 1 year (around 08/11/2023).  Vanna Scotland, MD  [facility] [address], [address] [place], [place] [address] [phone]

## 2023-05-23 ENCOUNTER — Other Ambulatory Visit: Payer: Self-pay | Admitting: Urology

## 2023-05-23 DIAGNOSIS — N5319 Other ejaculatory dysfunction: Secondary | ICD-10-CM

## 2023-05-23 DIAGNOSIS — G8929 Other chronic pain: Secondary | ICD-10-CM

## 2023-05-26 ENCOUNTER — Ambulatory Visit (INDEPENDENT_AMBULATORY_CARE_PROVIDER_SITE_OTHER): Payer: BC Managed Care – PPO | Admitting: Physician Assistant

## 2023-05-26 ENCOUNTER — Encounter: Payer: Self-pay | Admitting: Physician Assistant

## 2023-05-26 VITALS — BP 117/82 | HR 166 | Ht 66.0 in | Wt 157.8 lb

## 2023-05-26 DIAGNOSIS — G8929 Other chronic pain: Secondary | ICD-10-CM

## 2023-05-26 DIAGNOSIS — R102 Pelvic and perineal pain: Secondary | ICD-10-CM | POA: Diagnosis not present

## 2023-05-26 LAB — URINALYSIS, COMPLETE
Bilirubin, UA: NEGATIVE
Glucose, UA: NEGATIVE
Ketones, UA: NEGATIVE
Leukocytes,UA: NEGATIVE
Nitrite, UA: NEGATIVE
Protein,UA: NEGATIVE
RBC, UA: NEGATIVE
Specific Gravity, UA: 1.015 (ref 1.005–1.030)
Urobilinogen, Ur: 0.2 mg/dL (ref 0.2–1.0)
pH, UA: 7.5 (ref 5.0–7.5)

## 2023-05-26 LAB — MICROSCOPIC EXAMINATION: Bacteria, UA: NONE SEEN

## 2023-05-26 LAB — BLADDER SCAN AMB NON-IMAGING: Scan Result: 35

## 2023-05-26 NOTE — Progress Notes (Signed)
05/26/2023 11:01 AM   Johnathan Caldwell 1991/01/02 [id number]  CC: Chief Complaint  Patient presents with   Follow-up    lower back pain, frequent urination   HPI: Johnathan Caldwell is a 32 y.o. male with PMH chronic testicular/pelvic pain and occasional pain with ejaculation who presents today for evaluation of low back pain and urinary frequency.   Today he reports an approximate 4-day history of urinary frequency, sensations of incomplete bladder emptying, uncomfortable urethral sensations, low back pain, nausea, and constipation consistent with past episodes of prostatitis versus constipation.  He is unsure if his symptoms are due to his prostate or his bowels, so he is seeing [place] and another provider later to assess his stool burden.  He increased Flomax to twice daily and started meloxicam when his symptoms began and he notes that things overall have improved.  His low back pain is not present today.  He denies perineal fullness/discomfort, which he admits is rather atypical for him with prostatitis.  In-office UA and microscopy today pan negative. PVR 55mL.  PMH: Past Medical History:  Diagnosis Date   Acid reflux    Asthma    IBS (irritable bowel syndrome)     Surgical History: No past surgical history on file.  Home Medications:  Allergies as of 05/26/2023   No Known Allergies      Medication List        Accurate as of May 26, 2023 11:01 AM. If you have any questions, ask your nurse or doctor.          cetirizine 10 MG tablet Commonly known as: ZYRTEC Take 1 tablet by mouth daily.   esomeprazole 40 MG capsule Commonly known as: NEXIUM Take by mouth.   meloxicam 7.5 MG tablet Commonly known as: MOBIC TAKE 1 TABLET BY MOUTH DAILY   tamsulosin 0.4 MG Caps capsule Commonly known as: FLOMAX TAKE ONE CAPSULE BY MOUTH DAILY 30 MINUTES AFTER SAME MEAL EACH DAY        Allergies:  No Known Allergies  Family History: Family History  Problem Relation  Age of Onset   Kidney disease Mother    Ovarian cancer Mother    Prostate cancer Maternal Grandfather     Social History:   reports that he has never smoked. He has never used smokeless tobacco. He reports that he does not drink alcohol. No history on file for drug use.  Physical Exam: BP 117/82   Pulse (!) 166   Ht 5\' 6"  (1.676 m)   Wt 157 lb 12.8 oz (71.6 kg)   BMI 25.47 kg/m   Constitutional:  Alert and oriented, no acute distress, nontoxic appearing HEENT: [place], AT Cardiovascular: No clubbing, cyanosis, or edema Respiratory: Normal respiratory effort, no increased work of breathing GU: Normal sphincter tone.  Smooth, symmetrically enlarged 30+ cc prostate without nodules or induration.  Texture is rubbery, not boggy, no marked tenderness. Skin: No rashes, bruises or suspicious lesions Neurologic: Grossly intact, no focal deficits, moving all 4 extremities Psychiatric: Normal mood and affect  Laboratory Data: Results for orders placed or performed in visit on 05/26/23  Microscopic Examination   Urine  Result Value Ref Range   WBC, UA 0-5 0 - 5 /hpf   RBC, Urine 0-2 0 - 2 /hpf   Epithelial Cells (non renal) 0-10 0 - 10 /hpf   Bacteria, UA None seen None seen/Few  Urinalysis, Complete  Result Value Ref Range   Specific Gravity, UA 1.015 1.005 - 1.030  pH, UA 7.5 5.0 - 7.5   Color, UA Yellow Yellow   Appearance Ur Clear Clear   Leukocytes,UA Negative Negative   Protein,UA Negative Negative/Trace   Glucose, UA Negative Negative   Ketones, UA Negative Negative   RBC, UA Negative Negative   Bilirubin, UA Negative Negative   Urobilinogen, Ur 0.2 0.2 - 1.0 mg/dL   Nitrite, UA Negative Negative   Microscopic Examination See below:   BLADDER SCAN AMB NON-IMAGING  Result Value Ref Range   Scan Result 35 ml    Assessment & Plan:   1. Chronic pelvic pain in male Symptoms have improved on empiric meloxicam and Flomax and while symptoms are rather consistent with a  prostatitis flare, his physical exam is not particularly impressive today, he is emptying appropriately, and UA is bland.  We discussed continuing pharmacotherapy for symptom relief, keeping plans to assess his stool burden and completing a bowel regimen as instructed, and returning if symptoms do not improve.  At that point, may consider antibiotics for an underlying bacterial prostatitis. - Urinalysis, Complete - BLADDER SCAN AMB NON-IMAGING   Return if symptoms worsen or fail to improve.  Carman Ching, PA-C  [facility] [place] [address] [place], [place] [address] [phone]

## 2023-06-01 ENCOUNTER — Telehealth: Payer: Self-pay | Admitting: Physician Assistant

## 2023-06-01 DIAGNOSIS — G8929 Other chronic pain: Secondary | ICD-10-CM

## 2023-06-01 MED ORDER — SULFAMETHOXAZOLE-TRIMETHOPRIM 800-160 MG PO TABS
1.0000 | ORAL_TABLET | Freq: Two times a day (BID) | ORAL | 0 refills | Status: AC
Start: 2023-06-01 — End: 2023-06-29

## 2023-06-01 NOTE — Telephone Encounter (Signed)
Patient called back to check status of request for medication, and I gave him information regarding prescription sent to [facility]. He is now requesting a work note for today, and any further length of time you feel is needed for him to be able to fully function at work.

## 2023-06-01 NOTE — Telephone Encounter (Signed)
Please continue meloxicam and Flomax.  I have sent in Bactrim DS twice daily x 4 weeks to [facility].

## 2023-06-01 NOTE — Addendum Note (Signed)
Addended by: Debarah Crape on: 06/01/2023 11:26 AM   Modules accepted: Orders

## 2023-06-01 NOTE — Telephone Encounter (Signed)
Patient called this morning and reported that his symptoms have not improved. He is still having back pain and frequent urination. Does he need to be seen again, or can you prescribe something for him?

## 2023-06-02 NOTE — Telephone Encounter (Signed)
Ok to provide work note for yesterday. Unfortunately I cannot predict when he will be back to baseline, as he likely has some underlying pelvic floor dysfunction and PT hasn't been able to see him yet. I'd include in the note that we expect he may have some residual symptoms that require light duty over the next week.

## 2023-08-16 ENCOUNTER — Ambulatory Visit: Payer: BC Managed Care – PPO | Admitting: Urology

## 2023-08-16 ENCOUNTER — Encounter: Payer: Self-pay | Admitting: Urology

## 2023-10-13 ENCOUNTER — Encounter: Payer: Self-pay | Admitting: Physician Assistant

## 2023-10-13 ENCOUNTER — Ambulatory Visit: Payer: BC Managed Care – PPO | Admitting: Physician Assistant

## 2023-10-13 VITALS — BP 107/76 | HR 81

## 2023-10-13 DIAGNOSIS — G8929 Other chronic pain: Secondary | ICD-10-CM

## 2023-10-13 DIAGNOSIS — R102 Pelvic and perineal pain: Secondary | ICD-10-CM | POA: Diagnosis not present

## 2023-10-13 DIAGNOSIS — N411 Chronic prostatitis: Secondary | ICD-10-CM

## 2023-10-13 LAB — URINALYSIS, COMPLETE
Bilirubin, UA: NEGATIVE
Glucose, UA: NEGATIVE
Ketones, UA: NEGATIVE
Leukocytes,UA: NEGATIVE
Nitrite, UA: NEGATIVE
Protein,UA: NEGATIVE
RBC, UA: NEGATIVE
Specific Gravity, UA: 1.025 (ref 1.005–1.030)
Urobilinogen, Ur: 0.2 mg/dL (ref 0.2–1.0)
pH, UA: 6 (ref 5.0–7.5)

## 2023-10-13 LAB — MICROSCOPIC EXAMINATION: RBC, Urine: NONE SEEN /[HPF] (ref 0–2)

## 2023-10-13 MED ORDER — SULFAMETHOXAZOLE-TRIMETHOPRIM 800-160 MG PO TABS: 1.0000 | ORAL_TABLET | Freq: Two times a day (BID) | ORAL | 0 refills | Status: AC

## 2023-10-13 NOTE — Progress Notes (Signed)
10/13/2023 2:06 PM   Johnathan Caldwell [birth year] [id number]  CC: Chief Complaint  Patient presents with   Follow-up   HPI: Johnathan Caldwell is a 32 y.o. male with PMH chronic testicular/pelvic pain and occasional pain with ejaculation who presents today for evaluation of low back pain.   Today he reports a 7-day history of sudden onset low back pain and the sensation of hot urine.  He denies testicular swelling, penile discharge, fevers, or constipation.  He started daily meloxicam and Flomax, which help with symptom control but are not resolving the problem.    I previously prescribed him 4 weeks of Bactrim, which resolved his last flare.  He has stopped trying to schedule with pelvic floor PT after 3 failed attempts.  In-office UA and microscopy pan negative.   PMH: Past Medical History:  Diagnosis Date   Acid reflux    Asthma    IBS (irritable bowel syndrome)     Surgical History: No past surgical history on file.  Home Medications:  Allergies as of 10/13/2023   No Known Allergies      Medication List        Accurate as of October 13, 2023  2:06 PM. If you have any questions, ask your nurse or doctor.          cetirizine 10 MG tablet Commonly known as: ZYRTEC Take 1 tablet by mouth daily.   esomeprazole 40 MG capsule Commonly known as: NEXIUM Take by mouth.   meloxicam 7.5 MG tablet Commonly known as: MOBIC TAKE 1 TABLET BY MOUTH DAILY   propranolol 10 MG tablet Commonly known as: INDERAL Take by mouth.   tamsulosin 0.4 MG Caps capsule Commonly known as: FLOMAX TAKE ONE CAPSULE BY MOUTH DAILY 30 MINUTES AFTER SAME MEAL EACH DAY        Allergies:  No Known Allergies  Family History: Family History  Problem Relation Age of Onset   Kidney disease Mother    Ovarian cancer Mother    Prostate cancer Maternal Grandfather     Social History:   reports that he has never smoked. He has never used smokeless tobacco. He reports that he does not  drink alcohol. No history on file for drug use.  Physical Exam: BP 107/76   Pulse 81   Constitutional:  Alert and oriented, no acute distress, nontoxic appearing HEENT: [place], AT Cardiovascular: No clubbing, cyanosis, or edema Respiratory: Normal respiratory effort, no increased work of breathing Skin: No rashes, bruises or suspicious lesions Neurologic: Grossly intact, no focal deficits, moving all 4 extremities Psychiatric: Normal mood and affect  Laboratory Data: Results for orders placed or performed in visit on 10/13/23  Microscopic Examination   Urine  Result Value Ref Range   WBC, UA 0-5 0 - 5 /hpf   RBC, Urine None seen 0 - 2 /hpf   Epithelial Cells (non renal) 0-10 0 - 10 /hpf   Bacteria, UA Few None seen/Few  Urinalysis, Complete  Result Value Ref Range   Specific Gravity, UA 1.025 1.005 - 1.030   pH, UA 6.0 5.0 - 7.5   Color, UA Yellow Yellow   Appearance Ur Clear Clear   Leukocytes,UA Negative Negative   Protein,UA Negative Negative/Trace   Glucose, UA Negative Negative   Ketones, UA Negative Negative   RBC, UA Negative Negative   Bilirubin, UA Negative Negative   Urobilinogen, Ur 0.2 0.2 - 1.0 mg/dL   Nitrite, UA Negative Negative   Microscopic Examination See below:  Assessment & Plan:   1. Chronic pelvic pain in male Continue Flomax and meloxicam.  Will add 4 weeks of Bactrim for chronic prostatitis flare.  Will send urine for culture, though suspect this will be negative. - Urinalysis, Complete - sulfamethoxazole-trimethoprim (BACTRIM DS) 800-160 MG tablet; Take 1 tablet by mouth 2 (two) times daily for 28 days.  Dispense: 56 tablet; Refill: 0 - CULTURE, URINE COMPREHENSIVE   Return if symptoms worsen or fail to improve.  Carman Ching, PA-C  [facility] [place] [address], [address] [place], [place] [address] [phone]

## 2023-10-16 LAB — CULTURE, URINE COMPREHENSIVE

## 2023-12-09 ENCOUNTER — Other Ambulatory Visit: Payer: Self-pay | Admitting: Urology

## 2023-12-09 DIAGNOSIS — G8929 Other chronic pain: Secondary | ICD-10-CM

## 2023-12-09 DIAGNOSIS — N5319 Other ejaculatory dysfunction: Secondary | ICD-10-CM
